# Patient Record
Sex: Male | Born: 1995
Health system: Southern US, Community
[De-identification: ages and names within clinical notes are randomized; demographics above are authoritative.]

## PROBLEM LIST (undated history)

## (undated) DIAGNOSIS — J45909 Unspecified asthma, uncomplicated: Secondary | ICD-10-CM

## (undated) HISTORY — DX: Unspecified asthma, uncomplicated: J45.909

---
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
## 2014-04-01 ENCOUNTER — Ambulatory Visit: Payer: BC Managed Care – PPO

## 2014-04-01 ENCOUNTER — Ambulatory Visit: Payer: BC Managed Care – PPO | Admitting: Family Medicine

## 2014-04-01 VITALS — BP 120/85 | HR 91 | Temp 98.6°F | Resp 18 | Ht 75.0 in | Wt 205.0 lb

## 2014-04-01 DIAGNOSIS — R05 Cough: Secondary | ICD-10-CM

## 2014-04-01 DIAGNOSIS — R059 Cough, unspecified: Secondary | ICD-10-CM

## 2014-04-01 DIAGNOSIS — E039 Hypothyroidism, unspecified: Secondary | ICD-10-CM

## 2014-04-01 DIAGNOSIS — J309 Allergic rhinitis, unspecified: Secondary | ICD-10-CM

## 2014-04-01 DIAGNOSIS — R062 Wheezing: Secondary | ICD-10-CM

## 2014-04-01 LAB — POCT CBC
GRANULOCYTE PERCENT: 75.6 % (ref 37–80)
HCT, POC: 48.8 % (ref 43.5–53.7)
Hemoglobin: 16.3 g/dL (ref 14.1–18.1)
Lymph, poc: 1.6 (ref 0.6–3.4)
MCH, POC: 30.2 pg (ref 27–31.2)
MCHC: 33.4 g/dL (ref 31.8–35.4)
MCV: 90.3 fL (ref 80–97)
MID (CBC): 0.8 (ref 0–0.9)
MPV: 8.3 fL (ref 0–99.8)
PLATELET COUNT, POC: 291 10*3/uL (ref 142–424)
POC GRANULOCYTE: 7.5 — AB (ref 2–6.9)
POC LYMPH PERCENT: 16.5 %L (ref 10–50)
POC MID %: 7.9 % (ref 0–12)
RBC: 5.4 M/uL (ref 4.69–6.13)
RDW, POC: 13.9 %
WBC: 9.9 10*3/uL (ref 4.6–10.2)

## 2014-04-01 MED ORDER — ALBUTEROL SULFATE HFA 108 (90 BASE) MCG/ACT IN AERS: 2.0000 | INHALATION_SPRAY | Freq: Four times a day (QID) | RESPIRATORY_TRACT | Status: AC | PRN

## 2014-04-01 MED ORDER — FLUTICASONE PROPIONATE 50 MCG/ACT NA SUSP: 2.0000 | Freq: Every day | NASAL | Status: AC

## 2014-04-01 MED ORDER — PREDNISONE 20 MG PO TABS: ORAL_TABLET | ORAL | Status: DC

## 2014-04-01 MED ORDER — HYDROCODONE-HOMATROPINE 5-1.5 MG/5ML PO SYRP: 5.0000 mL | ORAL_SOLUTION | Freq: Three times a day (TID) | ORAL | Status: DC | PRN

## 2014-04-01 NOTE — Patient Instructions (Signed)
Use the albuterol as needed for cough and wheezing.  You can take the hycodan cough syrup as needed- however it will make you drowsy so do not take it when you need to drive  Try Claritin or zyrtec OTC for allergies, and also the flonase nasal spray Let me know if you do not feel better soon!  I will be in touch with your thyroid results asap

## 2014-04-01 NOTE — Progress Notes (Addendum)
[facility] [address], [place] [address] [phone]  Date:  04/01/2014   Name:  James LainWilliam J O'Halloran   DOB:  1995-12-29   MRN:  [id number]  PCP:  Shade FloodGREENE,JEFFREY R, MD    Chief Complaint: Cough and Generalized Body Aches   History of Present Illness:  James LainWilliam J O'Halloran is a 18 y.o. very pleasant male patient who presents with the following:  He is here today with illness.  In January he came home from college with an illness and had a persistent cough. He also had a tooth infection and was treated with an antibiotic which did help with the cough for a while- clindamycin.    The cough can be productive at times.   He was told that his "thyroid was messed up" when he had recent labs while at school. He was seen for "mild hypothyroidism" a few years ago with levothyroxine but he has not taken medication in some time.    They have not noted a fever, but he has had body aches.  He will wake up with sweats some of the time He has some sneezing, runny nose, no ST.   No GI symptoms  He has a history of EIA, however he has not noted any wheezing recently even with exercise. He is not currently using an inhaler and does not have one available.    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    Patient Active Problem List   Diagnosis Date Noted  . HYPOTHYROIDISM 07/27/2010  . ACUTE PHARYNGITIS 07/27/2010  . PNEUMOMEDIASTINUM 07/27/2010  . CHEST PAIN 07/27/2010  . EXERCISE INDUCED ASTHMA 12/07/2008    Past Medical History  Diagnosis Date  . Asthma     No past surgical history on file.  History  Substance Use Topics  . Smoking status: Never Smoker   . Smokeless tobacco: Not on file  . Alcohol Use: No    No family history on file.  No Known Allergies  Medication list has been reviewed and updated.  No current outpatient prescriptions on file prior to visit.   No current facility-administered medications on file prior to visit.    Review of  Systems:  As per HPI- otherwise negative.   Physical Examination: Filed Vitals:   04/01/14 1343  BP: 120/85  Pulse: 91  Temp: 98.6 F (37 C)  Resp: 18   Filed Vitals:   04/01/14 1343  Height: 6\' 3"  (1.905 m)  Weight: 205 lb (92.987 kg)   Body mass index is 25.62 kg/(m^2). Ideal Body Weight: Weight in (lb) to have BMI = 25: 199.6  GEN: WDWN, NAD, Non-toxic, A & O x 3, slim build, looks well HEENT: Atraumatic, Normocephalic. Neck supple. No masses, No LAD.   Bilateral TM wnl, oropharynx normal.  PEERL,EOMI.  He does have allergic shiners under both eyes.   Ears and Nose: No external deformity. CV: RRR, No M/G/R. No JVD. No thrill. No extra heart sounds. PULM: CTA B, no wheezes, crackles, rhonchi. No retractions. No resp. distress. No accessory muscle use. ABD: S, NT, ND. No rebound. No HSM. EXTR: No c/c/e NEURO Normal gait.  PSYCH: Normally interactive. Conversant. Not depressed or anxious appearing.  Calm demeanor.   [facility] reading (PRIMARY) by  Dr. Patsy Lageropland. CXR:  Negative CHEST 2 VIEW  COMPARISON: No comparisons  FINDINGS: Cardiopericardial silhouette within normal limits. Mediastinal contours normal. Trachea midline. No airspace disease or effusion.  IMPRESSION: No active cardiopulmonary disease.  Results  for orders placed in visit on 04/01/14  POCT CBC      Result Value Ref Range   WBC 9.9  4.6 - 10.2 K/uL   Lymph, poc 1.6  0.6 - 3.4   POC LYMPH PERCENT 16.5  10 - 50 %L   MID (cbc) 0.8  0 - 0.9   POC MID % 7.9  0 - 12 %M   POC Granulocyte 7.5 (*) 2 - 6.9   Granulocyte percent 75.6  37 - 80 %G   RBC 5.40  4.69 - 6.13 M/uL   Hemoglobin 16.3  14.1 - 18.1 g/dL   HCT, POC 82.948.8  56.243.5 - 53.7 %   MCV 90.3  80 - 97 fL   MCH, POC 30.2  27 - 31.2 pg   MCHC 33.4  31.8 - 35.4 g/dL   RDW, POC 13.013.9     Platelet Count, POC 291  142 - 424 K/uL   MPV 8.3  0 - 99.8 fL   Assessment and Plan: Cough - Plan: DG Chest 2 View, TSH, POCT CBC, predniSONE (DELTASONE) 20 MG  tablet, HYDROcodone-homatropine (HYCODAN) 5-1.5 MG/5ML syrup, Basic metabolic panel  Wheezing - Plan: albuterol (PROVENTIL HFA;VENTOLIN HFA) 108 (90 BASE) MCG/ACT inhaler  Unspecified hypothyroidism - Plan: TSH, Basic metabolic panel  Allergic rhinitis - Plan: fluticasone (FLONASE) 50 MCG/ACT nasal spray  Here today with a persistent cough, history of asthma.  Suspect his sx are more due to allergies than to any sort of illness. Will treat with prednisone, albuterol, flonase, OTC antihistamine Check TSH See patient instructions for more details.     Signed Abbe AmsterdamJessica Copland, MD  Received the rest of his labs.  Called and LMOM.  BMP ok.  TSH also ok at this time.  Would recommend that he have this rechecked in 3 to 6 months  Let me know if not better!

## 2014-04-02 ENCOUNTER — Encounter: Payer: Self-pay | Admitting: Family Medicine

## 2014-04-02 LAB — BASIC METABOLIC PANEL
BUN: 14 mg/dL (ref 6–23)
CHLORIDE: 99 meq/L (ref 96–112)
CO2: 30 mEq/L (ref 19–32)
Calcium: 9.6 mg/dL (ref 8.4–10.5)
Creat: 1.01 mg/dL (ref 0.50–1.35)
Glucose, Bld: 74 mg/dL (ref 70–99)
POTASSIUM: 4.5 meq/L (ref 3.5–5.3)
SODIUM: 138 meq/L (ref 135–145)

## 2014-04-02 LAB — TSH: TSH: 2.05 u[IU]/mL (ref 0.350–4.500)

## 2014-04-15 ENCOUNTER — Telehealth: Payer: Self-pay

## 2014-04-15 NOTE — Telephone Encounter (Signed)
Pt's mother called in said her sun was in here about a week and a half ago for his cough( Dr.Copland), was given prednisone, inhaer, claritin, and cough medice. At first it seemed to work, but the cough is back, and just as bad. Please call [phone]

## 2014-04-16 ENCOUNTER — Telehealth: Payer: Self-pay | Admitting: *Deleted

## 2014-04-16 DIAGNOSIS — R05 Cough: Secondary | ICD-10-CM

## 2014-04-16 DIAGNOSIS — R059 Cough, unspecified: Secondary | ICD-10-CM

## 2014-04-16 MED ORDER — AZITHROMYCIN 250 MG PO TABS: ORAL_TABLET | ORAL | Status: AC

## 2014-04-16 NOTE — Telephone Encounter (Signed)
He was seen with a cough on 5/13.  Noted to have a clear CXR and normal CBC.  He was treated with albuterol for 6 days, prednisone, hycodan, flonase  Called and LMOM on his cell- as long as he is feeling better but still coughing he can use some azithromycin.  If still not better he will let me know.  Spoke to his mom and relayed the same information.

## 2014-04-16 NOTE — Telephone Encounter (Signed)
Pt has no insurance- He was gotten better but the cough is still present. He did not get an antibiotic. He is wondering if now would be okay to start an antibiotic rather than come in. They have to pay for an OV out of pocket.

## 2014-04-16 NOTE — Telephone Encounter (Signed)
Lm for pt mother to bring pt back into the office for recheck.

## 2016-01-01 ENCOUNTER — Ambulatory Visit: Payer: Self-pay

## 2018-04-09 ENCOUNTER — Ambulatory Visit: Payer: Self-pay | Admitting: Family Medicine

## 2019-05-08 DIAGNOSIS — H5213 Myopia, bilateral: Secondary | ICD-10-CM | POA: Diagnosis not present

## 2019-06-06 DIAGNOSIS — M546 Pain in thoracic spine: Secondary | ICD-10-CM | POA: Diagnosis not present

## 2019-06-06 DIAGNOSIS — M542 Cervicalgia: Secondary | ICD-10-CM | POA: Diagnosis not present

## 2019-06-06 DIAGNOSIS — M545 Low back pain: Secondary | ICD-10-CM | POA: Diagnosis not present

## 2019-08-05 DIAGNOSIS — Z111 Encounter for screening for respiratory tuberculosis: Secondary | ICD-10-CM | POA: Diagnosis not present

## 2019-08-06 ENCOUNTER — Other Ambulatory Visit: Payer: Self-pay | Admitting: Orthopedic Surgery

## 2019-08-06 ENCOUNTER — Other Ambulatory Visit: Payer: Self-pay

## 2019-08-06 ENCOUNTER — Ambulatory Visit
Admission: RE | Admit: 2019-08-06 | Discharge: 2019-08-06 | Disposition: A | Discharge status: Home / Self Care | Payer: 59 | Source: Ambulatory Visit | Attending: Orthopedic Surgery | Admitting: Orthopedic Surgery

## 2019-08-06 DIAGNOSIS — M545 Low back pain, unspecified: Secondary | ICD-10-CM

## 2019-08-06 DIAGNOSIS — M5126 Other intervertebral disc displacement, lumbar region: Secondary | ICD-10-CM | POA: Diagnosis not present

## 2019-08-06 DIAGNOSIS — M542 Cervicalgia: Secondary | ICD-10-CM

## 2019-08-06 DIAGNOSIS — G8929 Other chronic pain: Secondary | ICD-10-CM

## 2019-08-06 DIAGNOSIS — M546 Pain in thoracic spine: Secondary | ICD-10-CM

## 2019-08-07 DIAGNOSIS — Z111 Encounter for screening for respiratory tuberculosis: Secondary | ICD-10-CM | POA: Diagnosis not present

## 2019-08-11 ENCOUNTER — Other Ambulatory Visit: Payer: Self-pay

## 2019-08-11 DIAGNOSIS — M5416 Radiculopathy, lumbar region: Secondary | ICD-10-CM | POA: Diagnosis not present

## 2019-08-13 DIAGNOSIS — M5416 Radiculopathy, lumbar region: Secondary | ICD-10-CM | POA: Diagnosis not present

## 2019-08-13 DIAGNOSIS — M5116 Intervertebral disc disorders with radiculopathy, lumbar region: Secondary | ICD-10-CM | POA: Diagnosis not present

## 2019-08-15 DIAGNOSIS — Z0189 Encounter for other specified special examinations: Secondary | ICD-10-CM | POA: Diagnosis not present

## 2019-09-03 DIAGNOSIS — M5116 Intervertebral disc disorders with radiculopathy, lumbar region: Secondary | ICD-10-CM | POA: Diagnosis not present

## 2019-09-03 DIAGNOSIS — M5416 Radiculopathy, lumbar region: Secondary | ICD-10-CM | POA: Diagnosis not present

## 2019-09-22 DIAGNOSIS — M5416 Radiculopathy, lumbar region: Secondary | ICD-10-CM | POA: Diagnosis not present

## 2019-09-25 ENCOUNTER — Other Ambulatory Visit: Payer: Self-pay | Admitting: Orthopedic Surgery

## 2019-10-12 IMAGING — MR MR LUMBAR SPINE W/O CM
4 of 5 series · 27 of 48 positions shown · non-contrast
Comparison: None.

CLINICAL DATA: Left-sided low back pain radiating down the left leg
the past year. No prior surgery.

EXAM:
MRI LUMBAR SPINE WITHOUT CONTRAST
TECHNIQUE: Multiplanar, multisequence MR imaging of the lumbar spine was
performed. No intravenous contrast was administered.

[Series 2: T2 · sagittal · 4.0mm · 1.09mm/px · 6 of 16 slices shown (1 of 2)]
[im 1/16]
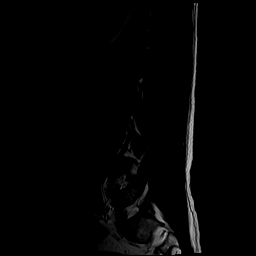
[im 4/16]
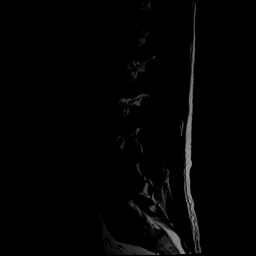
[im 7/16]
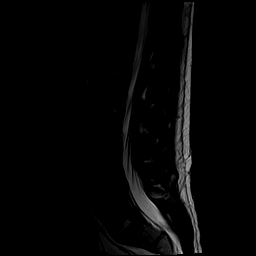
[im 10/16]
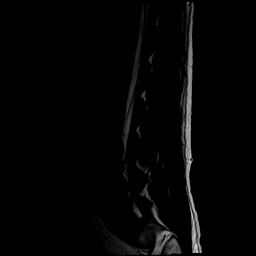
[im 13/16]
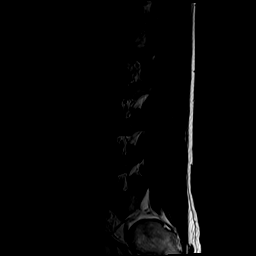
[im 16/16]
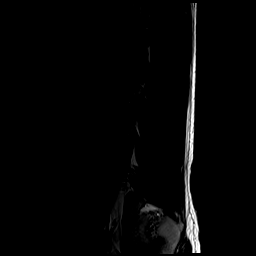

[Series 4: T1 · sagittal · 4.0mm · 1.09mm/px · 5 of 16 slices shown (1 of 2)]
[im 1/16]
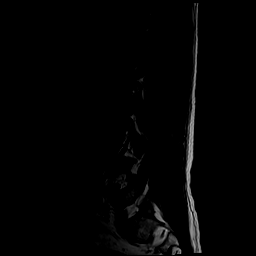
[im 4/16]
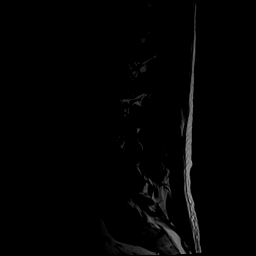
[im 8/16]
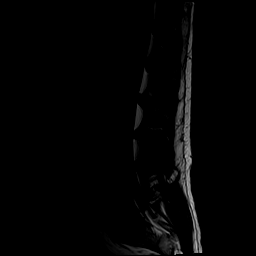
[im 12/16]
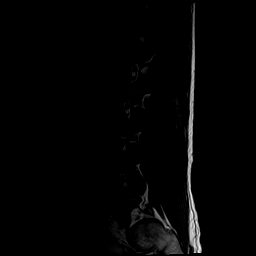
[im 16/16]
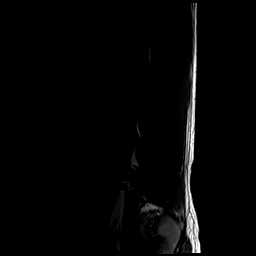

[Series 5: T2 · axial · 4.0mm · 0.39mm/px · z∈[-86,+162]mm · 10 of 49 slices shown (2 of 2)]
[im 4/49]
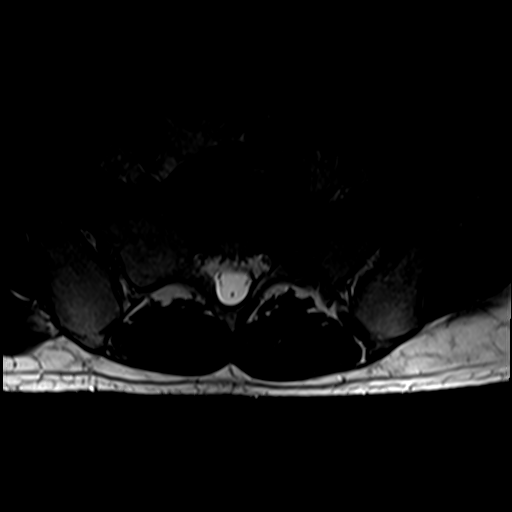
[im 7/49]
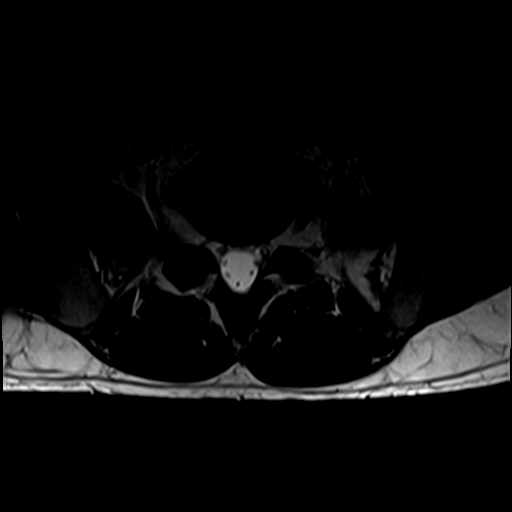
[im 10/49]
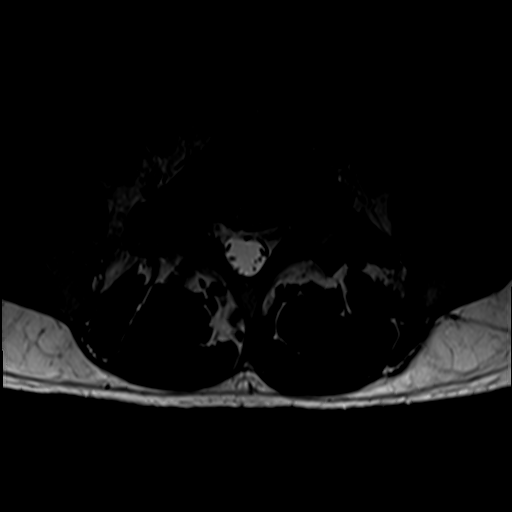
[im 17/49]
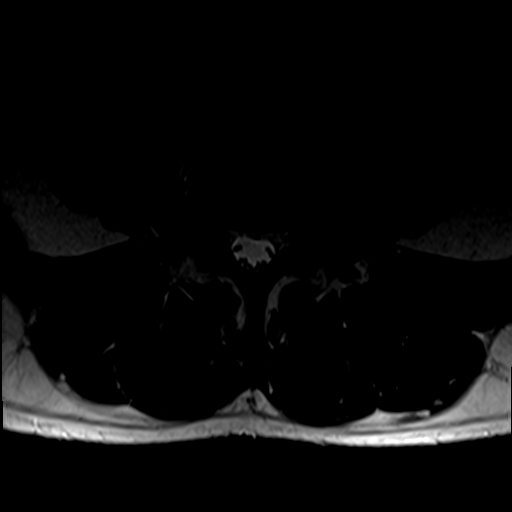
[im 23/49]
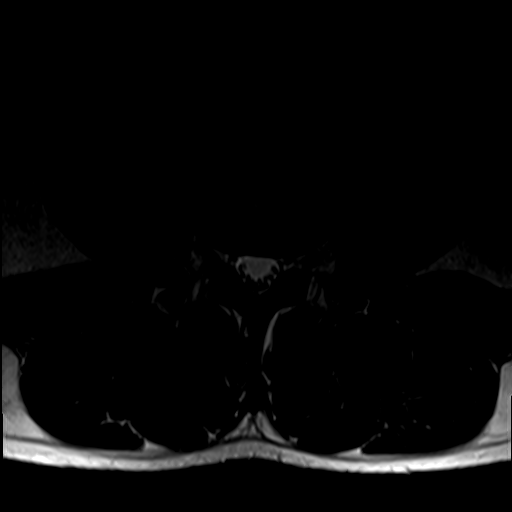
[im 26/49]
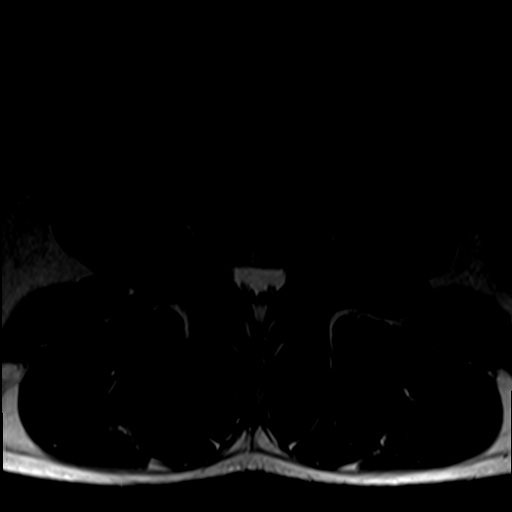
[im 29/49]
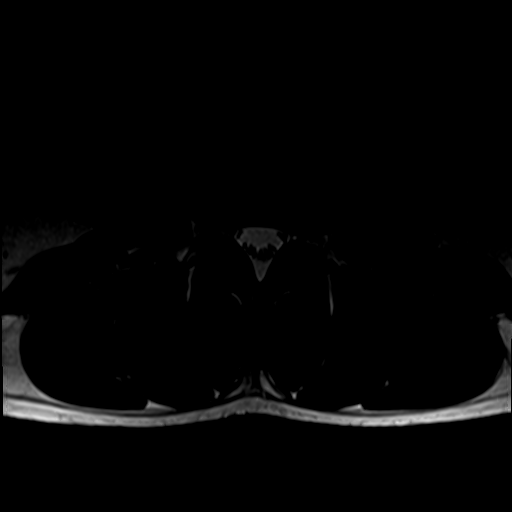
[im 36/49]
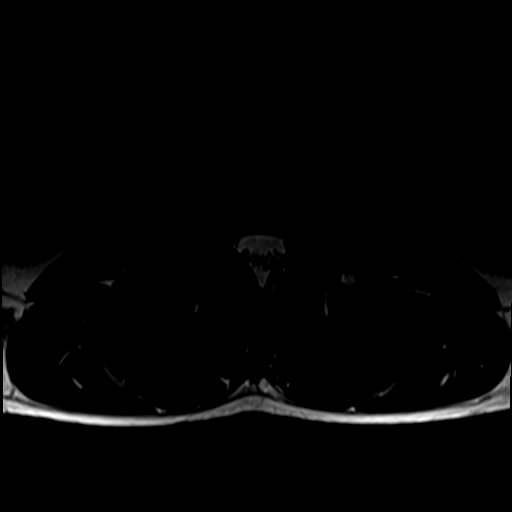
[im 42/49]
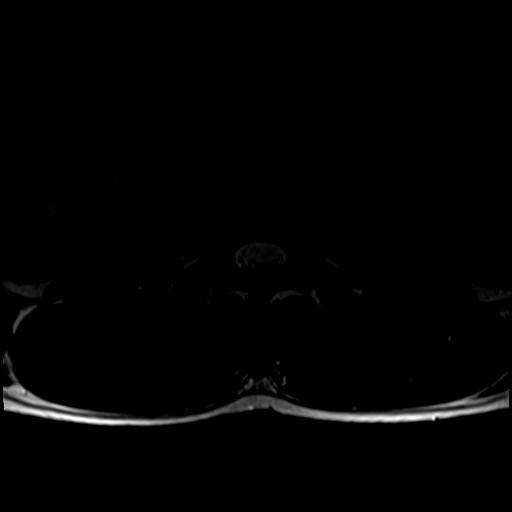
[im 49/49]
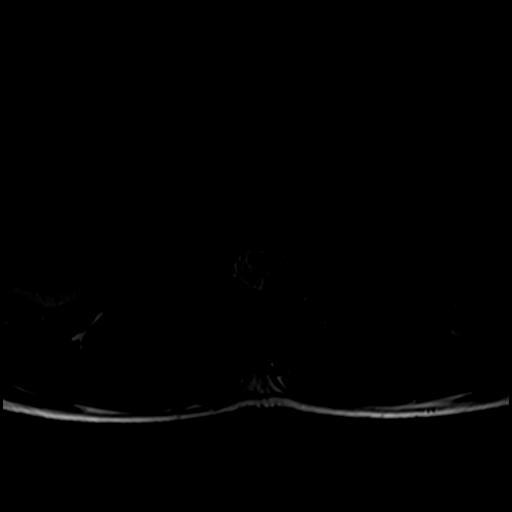

[Series 6: T1 · axial · 4.0mm · 0.39mm/px · z∈[-86,+128]mm · 6 of 49 slices shown (2 of 2)]
[im 4/49]
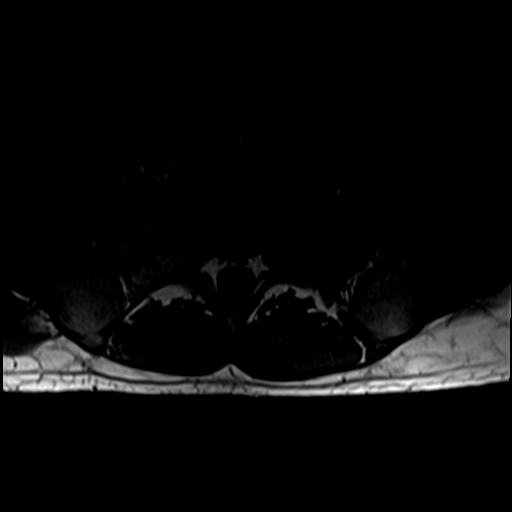
[im 7/49]
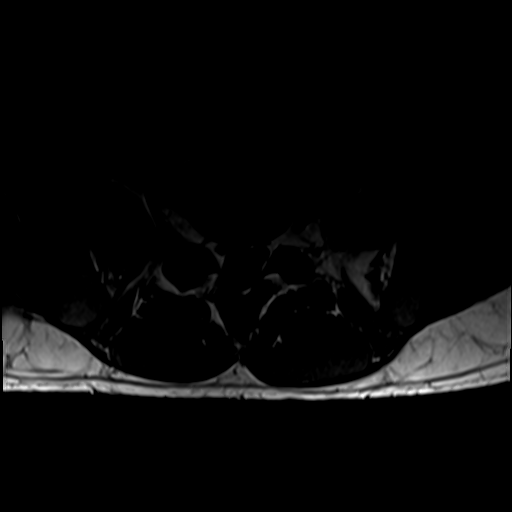
[im 10/49]
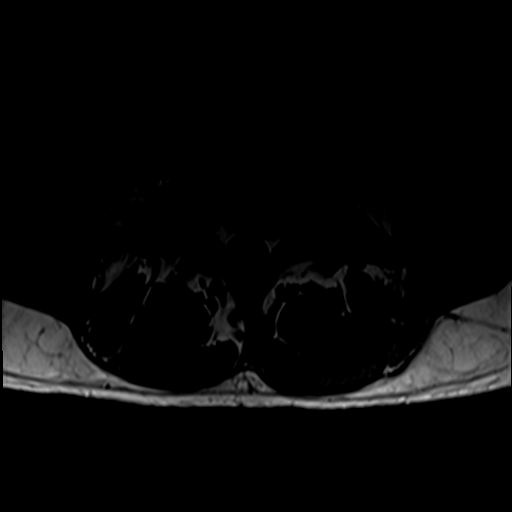
[im 17/49]
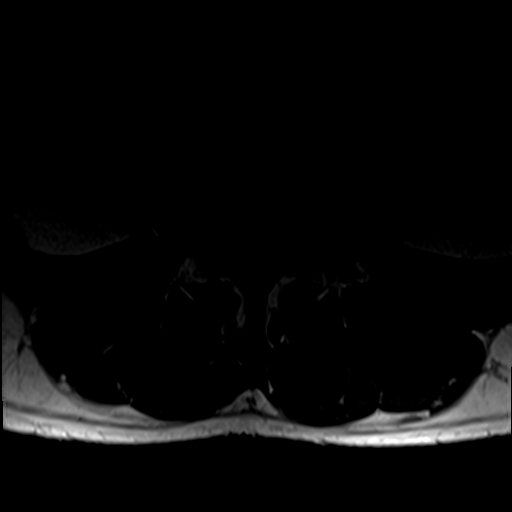
[im 26/49]
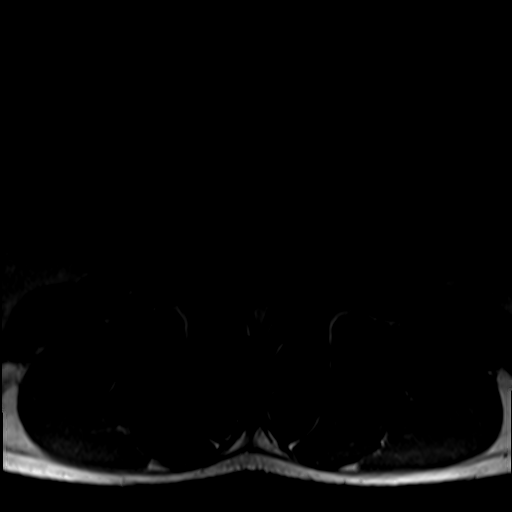
[im 42/49]
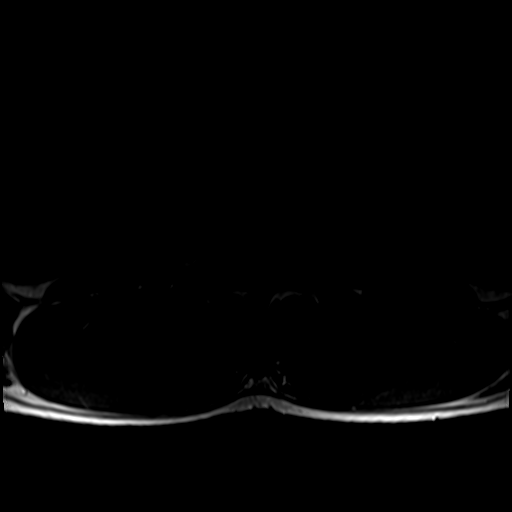

[27 of 48 positions shown; findings below may reference images not displayed]

FINDINGS: Segmentation: Transitional lumbosacral anatomy with partial
sacralization of L5 on the left. The last well-formed disc space is
designated L5-S1 for the purposes of this report.

Alignment:  Physiologic.

Vertebrae:  No fracture, evidence of discitis, or bone lesion.

Conus medullaris and cauda equina: Conus extends to the T11-T12
level. Conus and cauda equina appear normal.

Paraspinal and other soft tissues: Negative.

Disc levels:

T12-L1 to L3-L4:  Negative.

L4-L5: Left paracentral and subarticular disc extrusion migrating
inferiorly with impingement of the descending left L5 nerve root.
Mild bilateral neuroforaminal stenosis. No spinal canal stenosis.

L5-S1:  Negative.
IMPRESSION: 1. Left paracentral and subarticular disc extrusion at L4-L5 with
impingement of the descending left L5 nerve root.

## 2019-10-15 ENCOUNTER — Ambulatory Visit: Admit: 2019-10-15 | Payer: 59 | Admitting: Orthopedic Surgery

## 2019-10-15 SURGERY — LUMBAR LAMINECTOMY/DECOMPRESSION MICRODISCECTOMY
Anesthesia: General | Laterality: Left

## 2019-10-23 DIAGNOSIS — Z20828 Contact with and (suspected) exposure to other viral communicable diseases: Secondary | ICD-10-CM | POA: Diagnosis not present

## 2019-10-31 DIAGNOSIS — Z20828 Contact with and (suspected) exposure to other viral communicable diseases: Secondary | ICD-10-CM | POA: Diagnosis not present

## 2019-10-31 DIAGNOSIS — R509 Fever, unspecified: Secondary | ICD-10-CM | POA: Diagnosis not present

## 2019-10-31 DIAGNOSIS — Z7189 Other specified counseling: Secondary | ICD-10-CM | POA: Diagnosis not present

## 2019-10-31 DIAGNOSIS — M7918 Myalgia, other site: Secondary | ICD-10-CM | POA: Diagnosis not present

## 2019-10-31 DIAGNOSIS — R519 Headache, unspecified: Secondary | ICD-10-CM | POA: Diagnosis not present

## 2020-01-02 DIAGNOSIS — Z20828 Contact with and (suspected) exposure to other viral communicable diseases: Secondary | ICD-10-CM | POA: Diagnosis not present

## 2020-01-09 DIAGNOSIS — M5416 Radiculopathy, lumbar region: Secondary | ICD-10-CM | POA: Diagnosis not present

## 2020-01-09 DIAGNOSIS — M5116 Intervertebral disc disorders with radiculopathy, lumbar region: Secondary | ICD-10-CM | POA: Diagnosis not present

## 2020-09-15 ENCOUNTER — Other Ambulatory Visit: Payer: Self-pay | Admitting: Orthopedic Surgery

## 2020-09-15 DIAGNOSIS — M542 Cervicalgia: Secondary | ICD-10-CM

## 2020-10-05 ENCOUNTER — Other Ambulatory Visit: Payer: Self-pay

## 2020-10-05 ENCOUNTER — Ambulatory Visit
Admission: RE | Admit: 2020-10-05 | Discharge: 2020-10-05 | Disposition: A | Discharge status: Home / Self Care | Payer: BC Managed Care – PPO | Source: Ambulatory Visit | Attending: Orthopedic Surgery | Admitting: Orthopedic Surgery

## 2020-10-05 DIAGNOSIS — M542 Cervicalgia: Secondary | ICD-10-CM

## 2020-11-05 ENCOUNTER — Other Ambulatory Visit: Payer: Self-pay | Admitting: Orthopedic Surgery

## 2020-11-05 DIAGNOSIS — M545 Low back pain, unspecified: Secondary | ICD-10-CM
